# Patient Record
Sex: Male | Born: 2000
Health system: Southern US, Community
[De-identification: ages and names within clinical notes are randomized; demographics above are authoritative.]

## PROBLEM LIST (undated history)

## (undated) DIAGNOSIS — J45909 Unspecified asthma, uncomplicated: Secondary | ICD-10-CM

## (undated) DIAGNOSIS — K649 Unspecified hemorrhoids: Secondary | ICD-10-CM

## (undated) DIAGNOSIS — K219 Gastro-esophageal reflux disease without esophagitis: Secondary | ICD-10-CM

## (undated) DIAGNOSIS — Z8489 Family history of other specified conditions: Secondary | ICD-10-CM

## (undated) DIAGNOSIS — J189 Pneumonia, unspecified organism: Secondary | ICD-10-CM

## (undated) DIAGNOSIS — Z973 Presence of spectacles and contact lenses: Secondary | ICD-10-CM

## (undated) DIAGNOSIS — L0591 Pilonidal cyst without abscess: Secondary | ICD-10-CM

## (undated) DIAGNOSIS — Z87820 Personal history of traumatic brain injury: Secondary | ICD-10-CM

## (undated) DIAGNOSIS — G43909 Migraine, unspecified, not intractable, without status migrainosus: Secondary | ICD-10-CM

## (undated) DIAGNOSIS — Z8709 Personal history of other diseases of the respiratory system: Secondary | ICD-10-CM

## (undated) DIAGNOSIS — H539 Unspecified visual disturbance: Secondary | ICD-10-CM

## (undated) HISTORY — DX: Gastro-esophageal reflux disease without esophagitis: K21.9

## (undated) HISTORY — PX: OTHER SURGICAL HISTORY: SHX169

## (undated) HISTORY — PX: ADENOIDECTOMY: SUR15

## (undated) HISTORY — DX: Migraine, unspecified, not intractable, without status migrainosus: G43.909

---
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
## 2022-05-08 ENCOUNTER — Telehealth: Payer: Self-pay

## 2022-05-08 NOTE — Telephone Encounter (Signed)
Patient mother wanted to know if patient could come on as new patient of Dr.Fry as he sees their whole family and patient has aged out of pediatrics. Okay to schedule?        Please advise

## 2022-05-09 NOTE — Telephone Encounter (Addendum)
Patient Randy [name], mom  requesting if Dr. Sarajane Jews will be willing to see son [name] as new patient.

## 2022-05-10 NOTE — Telephone Encounter (Signed)
Yes I agreed to see him  

## 2022-05-10 NOTE — Telephone Encounter (Signed)
Lvm for mom Randy [name] Dr. Sarajane Jews agreed to see Randy [name] as a new patient.

## 2022-06-13 ENCOUNTER — Ambulatory Visit: Payer: 59 | Admitting: Family Medicine

## 2022-06-20 ENCOUNTER — Ambulatory Visit (INDEPENDENT_AMBULATORY_CARE_PROVIDER_SITE_OTHER): Payer: PRIVATE HEALTH INSURANCE | Admitting: Family Medicine

## 2022-06-20 ENCOUNTER — Encounter: Payer: Self-pay | Admitting: Family Medicine

## 2022-06-20 VITALS — BP 118/78 | HR 79 | Temp 98.3°F | Ht 73.25 in | Wt 206.0 lb

## 2022-06-20 DIAGNOSIS — Z0000 Encounter for general adult medical examination without abnormal findings: Secondary | ICD-10-CM

## 2022-06-20 DIAGNOSIS — G43909 Migraine, unspecified, not intractable, without status migrainosus: Secondary | ICD-10-CM

## 2022-06-20 HISTORY — DX: Migraine, unspecified, not intractable, without status migrainosus: G43.909

## 2022-06-20 MED ORDER — SUMATRIPTAN SUCCINATE 100 MG PO TABS: 100.0000 mg | ORAL_TABLET | ORAL | 11 refills | Status: AC | PRN

## 2022-06-20 MED ORDER — SUMATRIPTAN SUCCINATE 100 MG PO TABS: 100.0000 mg | ORAL_TABLET | ORAL | 11 refills | Status: DC | PRN

## 2022-06-20 NOTE — Progress Notes (Signed)
Subjective:    Patient ID: Randy [name], male    DOB: August 30, 2001, 21 y.o.   MRN: [id number]  HPI Here to establish with [place] after transferring from his pediatrician, Dr. Barton Fanny, and for a well exam. He feels fine and has no concerns. He has been healthy most of his life, although he does have migraine headaches. He averages one of these every month or two. He has never been prescribed anything for these. He had asthma as a child, but he seems to have outgrown this. He was hospitalized in 2019 for a right sided pneumonia that caused a pleural effusion. He was treated with a chest tube. No etiologic agent was ever identified. He recovered completely, and he has no residual problems. Currently he lives with his parents. 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 He would like to return to [place] after that and work for [employer].    Review of Systems  Constitutional: Negative.   HENT: Negative.    Eyes: Negative.   Respiratory: Negative.    Cardiovascular: Negative.   Gastrointestinal: Negative.   Genitourinary: Negative.   Musculoskeletal: Negative.   Skin: Negative.   Neurological:  Positive for headaches.  Psychiatric/Behavioral: Negative.         Objective:   Physical Exam Constitutional:      General: He is not in acute distress.    Appearance: Normal appearance. He is well-developed. He is not diaphoretic.  HENT:     Head: Normocephalic and atraumatic.     Right Ear: External ear normal.     Left Ear: External ear normal.     Nose: Nose normal.     Mouth/Throat:     Pharynx: No oropharyngeal exudate.  Eyes:     General: No scleral icterus.       Right eye: No discharge.        Left eye: No discharge.     Conjunctiva/sclera: Conjunctivae normal.     Pupils: Pupils are equal, round, and reactive to light.  Neck:     Thyroid: No thyromegaly.     Vascular: No JVD.      Trachea: No tracheal deviation.  Cardiovascular:     Rate and Rhythm: Normal rate and regular rhythm.     Heart sounds: Normal heart sounds. No murmur heard.    No friction rub. No gallop.  Pulmonary:     Effort: Pulmonary effort is normal. No respiratory distress.     Breath sounds: Normal breath sounds. No wheezing or rales.  Chest:     Chest wall: No tenderness.  Abdominal:     General: Bowel sounds are normal. There is no distension.     Palpations: Abdomen is soft. There is no mass.     Tenderness: There is no abdominal tenderness. There is no guarding or rebound.  Genitourinary:    Penis: Normal. No tenderness.      Testes: Normal.  Musculoskeletal:        General: No tenderness. Normal range of motion.     Cervical back: Neck supple.  Lymphadenopathy:     Cervical: No cervical adenopathy.  Skin:    General: Skin is warm and dry.     Coloration: Skin is not pale.     Findings: No erythema or rash.  Neurological:     Mental Status: He is alert and oriented to person, place, and  time.     Cranial Nerves: No cranial nerve deficit.     Motor: No abnormal muscle tone.     Coordination: Coordination normal.     Deep Tendon Reflexes: Reflexes are normal and symmetric. Reflexes normal.  Psychiatric:        Behavior: Behavior normal.        Thought Content: Thought content normal.        Judgment: Judgment normal.           Assessment & Plan:  Intro visit and well exam for this young man. He seems to be quite healthy. He will try Sumatriptan 100 mg whenever he gets a migraine. Recheck in one year.  Alysia Penna, MD

## 2022-08-20 ENCOUNTER — Encounter: Payer: Self-pay | Admitting: Family Medicine

## 2022-08-20 ENCOUNTER — Ambulatory Visit (INDEPENDENT_AMBULATORY_CARE_PROVIDER_SITE_OTHER): Payer: PRIVATE HEALTH INSURANCE | Admitting: Family Medicine

## 2022-08-20 VITALS — BP 118/70 | HR 64 | Temp 98.1°F | Wt 203.0 lb

## 2022-08-20 DIAGNOSIS — J020 Streptococcal pharyngitis: Secondary | ICD-10-CM

## 2022-08-20 DIAGNOSIS — J029 Acute pharyngitis, unspecified: Secondary | ICD-10-CM

## 2022-08-20 LAB — POCT RAPID STREP A (OFFICE): Rapid Strep A Screen: POSITIVE — AB

## 2022-08-20 MED ORDER — CEFUROXIME AXETIL 500 MG PO TABS: 500.0000 mg | ORAL_TABLET | Freq: Two times a day (BID) | ORAL | 0 refills | Status: AC

## 2022-08-20 NOTE — Progress Notes (Signed)
   Subjective:    Patient ID: Tyreak Reagle, male    DOB: 2001/03/18, 21 y.o.   MRN: [id number]  HPI Here for 6 days of a ST, fever, PND, and coughing up yellow sputum. No SOB or wheezing. Taking Tylenol.    Review of Systems  Constitutional:  Positive for fever.  HENT:  Positive for congestion, postnasal drip and sore throat. Negative for ear pain and sinus pressure.   Eyes: Negative.   Respiratory:  Positive for cough. Negative for shortness of breath and wheezing.        Objective:   Physical Exam Constitutional:      Appearance: Normal appearance. He is not ill-appearing.  HENT:     Right Ear: Tympanic membrane, ear canal and external ear normal.     Left Ear: Tympanic membrane, ear canal and external ear normal.     Nose: Nose normal.     Mouth/Throat:     Pharynx: Oropharynx is clear.  Eyes:     Conjunctiva/sclera: Conjunctivae normal.  Pulmonary:     Effort: Pulmonary effort is normal.     Breath sounds: Normal breath sounds.  Lymphadenopathy:     Cervical: No cervical adenopathy.  Neurological:     Mental Status: He is alert.           Assessment & Plan:  Strep pharyngitis, treat with 10 days of Cefuroxime.  Alysia Penna, MD

## 2022-08-20 NOTE — Addendum Note (Signed)
Addended by: Wyvonne Lenz on: 08/20/2022 04:21 PM   Modules accepted: Orders

## 2022-08-22 ENCOUNTER — Encounter: Payer: Self-pay | Admitting: Family Medicine

## 2022-08-22 ENCOUNTER — Ambulatory Visit (INDEPENDENT_AMBULATORY_CARE_PROVIDER_SITE_OTHER): Payer: PRIVATE HEALTH INSURANCE | Admitting: Family Medicine

## 2022-08-22 VITALS — BP 118/70 | HR 61 | Temp 98.4°F | Wt 204.0 lb

## 2022-08-22 DIAGNOSIS — K645 Perianal venous thrombosis: Secondary | ICD-10-CM | POA: Diagnosis not present

## 2022-08-22 NOTE — Progress Notes (Signed)
   Subjective:    Patient ID: Randy [name], male    DOB: 03-11-2001, 21 y.o.   MRN: [id number]  HPI Here for one week of mild pain at the anus. His BM's are regular, though he has some pain when passing a stool. No bleeding.    Review of Systems  Constitutional: Negative.   Respiratory: Negative.    Cardiovascular: Negative.   Gastrointestinal:  Positive for rectal pain.       Objective:   Physical Exam Constitutional:      General: He is not in acute distress.    Appearance: Normal appearance.  Cardiovascular:     Rate and Rhythm: Normal rate and regular rhythm.     Pulses: Normal pulses.     Heart sounds: Normal heart sounds.  Pulmonary:     Effort: Pulmonary effort is normal.     Breath sounds: Normal breath sounds.  Genitourinary:    Comments: There is a 1.0 cm thrombosed hemorrhoid at the anal verge  Neurological:     Mental Status: He is alert.           Assessment & Plan:  Thrombosed hemorrhoid. After informed consent was obtained, the hemorrhoid was opened with a scalpel, and all the thrombosed blood was extracted. He tolerated the procedure well, and he felt better afterwards. He will go home now and soak in a warm bath. Recheck as needed.  Alysia Penna, MD

## 2023-02-17 ENCOUNTER — Encounter: Payer: Self-pay | Admitting: Family Medicine

## 2023-02-17 ENCOUNTER — Ambulatory Visit (INDEPENDENT_AMBULATORY_CARE_PROVIDER_SITE_OTHER): Payer: PRIVATE HEALTH INSURANCE | Admitting: Family Medicine

## 2023-02-17 VITALS — BP 120/68 | HR 80 | Temp 99.0°F | Wt 207.0 lb

## 2023-02-17 DIAGNOSIS — L0591 Pilonidal cyst without abscess: Secondary | ICD-10-CM | POA: Diagnosis not present

## 2023-02-17 MED ORDER — MUPIROCIN CALCIUM 2 % EX CREA: 1.0000 | TOPICAL_CREAM | Freq: Two times a day (BID) | CUTANEOUS | 2 refills | Status: AC

## 2023-02-17 NOTE — Progress Notes (Signed)
   Subjective:    Patient ID: Randy [name], male    DOB: 10-09-2000, 22 y.o.   MRN: [id number]  HPI Here for a tender lump at the base of his spine that appeared about 3 years ago. It has slowly gotten larger, and for the past week has been draining clear fluid.     Review of Systems  Constitutional: Negative.   Respiratory: Negative.    Cardiovascular: Negative.   Neurological: Negative.        Objective:   Physical Exam Constitutional:      Appearance: Normal appearance.  Cardiovascular:     Rate and Rhythm: Normal rate and regular rhythm.     Pulses: Normal pulses.     Heart sounds: Normal heart sounds.  Pulmonary:     Effort: Pulmonary effort is normal.     Breath sounds: Normal breath sounds.  Skin:    Comments: There is a round firm non-tender cystic lesion at the left side of the base of the spine. There are 4 tiny perforations in the center of the lower spine that are draining clear fluid   Neurological:     Mental Status: He is alert.           Assessment & Plan:  Pilonidal cyst. This needs to be removed. We will refer him to Neurosurgery. Gershon Crane, MD

## 2023-02-17 NOTE — Addendum Note (Signed)
Addended by: Gershon Crane A on: 02/17/2023 05:02 PM   Modules accepted: Orders

## 2023-03-10 ENCOUNTER — Other Ambulatory Visit (HOSPITAL_COMMUNITY): Payer: Self-pay | Admitting: Neurosurgery

## 2023-03-10 DIAGNOSIS — L0501 Pilonidal cyst with abscess: Secondary | ICD-10-CM

## 2023-03-15 ENCOUNTER — Ambulatory Visit (HOSPITAL_COMMUNITY)
Admission: RE | Admit: 2023-03-15 | Discharge: 2023-03-15 | Disposition: A | Discharge status: Home / Self Care | Payer: PRIVATE HEALTH INSURANCE | Source: Ambulatory Visit | Attending: Neurosurgery | Admitting: Neurosurgery

## 2023-03-15 DIAGNOSIS — L0501 Pilonidal cyst with abscess: Secondary | ICD-10-CM | POA: Diagnosis present

## 2023-03-15 MED ORDER — GADOBUTROL 1 MMOL/ML IV SOLN
10.0000 mL | Freq: Once | INTRAVENOUS | Status: AC | PRN
  Administered 2023-03-15: 10 mL via INTRAVENOUS

## 2023-03-26 ENCOUNTER — Ambulatory Visit: Payer: Self-pay | Admitting: Surgery

## 2023-03-26 NOTE — H&P (Signed)
  Randy [name] [id number]   Referring Provider:  Carmela Hurt, MD   Subjective   Chief Complaint: New Consultation (Pilonidal cyst)     History of Present Illness:    Very pleasant 22 year old male who is referred by Dr. Mikal Plane, to whom he was referred by Dr. Clent Ridges, for a pilonidal cyst.  An MRI was ordered to rule out neural tissue involvement.  This was negative for any soft tissue abnormality, or any other abnormal findings. He noted a lump at the base of his spine that has been present for about 3 years and slowly increasing in size, some serous drainage.    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    Review of Systems: A complete review of systems was obtained from the patient.  I have reviewed this information and discussed as appropriate with the patient.  See HPI as well for other ROS.   Medical History: History reviewed. No pertinent past medical history.  There is no problem list on file for this patient.   Past Surgical History:  Procedure Laterality Date   ADENOIDECTOMY     tubes in ears       No Known Allergies  Current Outpatient Medications on File Prior to Visit  Medication Sig Dispense Refill   acetaminophen 325 mg Cap      ibuprofen (MOTRIN) 200 MG tablet Take by mouth every 6 (six) hours     No current facility-administered medications on file prior to visit.    Family History  Problem Relation Age of Onset   Skin cancer Mother    Deep vein thrombosis (DVT or abnormal blood clot formation) Mother    Skin cancer Sister      Social History   Tobacco Use  Smoking Status Never  Smokeless Tobacco Never     Social History   Socioeconomic History   Marital status: Single  Tobacco Use   Smoking status: Never   Smokeless tobacco: Never  Vaping Use   Vaping status: Never Used  Substance and Sexual Activity   Alcohol use: Never   Drug use: Never    Objective:    Vitals:   03/26/23 1421  BP: 128/76  Pulse: 88  Temp: 36.7 C (98 F)   SpO2: 98%  Weight: 95.2 kg (209 lb 12.8 oz)  Height: 186.7 cm (6' 1.5")  PainSc: 0-No pain    Body mass index is 27.3 kg/m.  Gen: A&Ox3, no distress  Unlabored respirations There is a approximately 1 cm subcutaneous cyst to the left of midline at the superior aspect of the natal cleft.  No opening or active drainage at this time.  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  I recommend trephination and we discussed the procedure in detail as well as risks of bleeding, infection, pain, scarring, wound healing problems, recurrence of the disease process.  Discussed typical post-op recovery and timeline. Questions welcomed and answered to their satisfaction.  They wish to proceed.   Stephani Janak Carlye Grippe, MD

## 2023-04-23 ENCOUNTER — Encounter (HOSPITAL_BASED_OUTPATIENT_CLINIC_OR_DEPARTMENT_OTHER): Payer: Self-pay | Admitting: Surgery

## 2023-04-24 ENCOUNTER — Encounter (HOSPITAL_BASED_OUTPATIENT_CLINIC_OR_DEPARTMENT_OTHER): Payer: Self-pay | Admitting: Surgery

## 2023-04-24 NOTE — Progress Notes (Signed)
Spoke w/ via phone for pre-op interview--- pt's mother, chelsea  Lab needs dos----   no            Lab results------ no COVID test -----patient states asymptomatic no test needed Arrive at ------- 0830 on 05-07-2023 NPO after MN  Med rec completed Medications to take morning of surgery ----- imitrex if needed Diabetic medication ----- n/a Patient instructed no nail polish to be worn day of surgery Patient instructed to bring photo id and insurance card day of surgery Patient aware to have Driver (ride ) / caregiver    for 24 hours after surgery -- mother, chelsea Patient Special Instructions ----- mother verbalized understanding obtain hibiclens soap from any pharmacy, pt to take shower with hibiclens night before and morning of surgery from chin to toe Pre-Op special Instructions -----  pt mother stated pt has a upper front tooth that is not permanent tooth , advised her pt needs to let anesthesia be aware of this in pre-op, mother stated she is aware something could happen to the tooth Patient verbalized understanding of instructions that were given at this phone interview. Patient denies shortness of breath, chest pain, fever, cough at this phone interview.

## 2023-05-07 ENCOUNTER — Encounter (HOSPITAL_BASED_OUTPATIENT_CLINIC_OR_DEPARTMENT_OTHER)
Admission: RE | Disposition: A | Discharge status: Home / Self Care | Payer: Self-pay | Source: Home / Self Care | Attending: Surgery

## 2023-05-07 ENCOUNTER — Ambulatory Visit (HOSPITAL_BASED_OUTPATIENT_CLINIC_OR_DEPARTMENT_OTHER)
Admission: RE | Admit: 2023-05-07 | Discharge: 2023-05-07 | Disposition: A | Discharge status: Home / Self Care | Payer: PRIVATE HEALTH INSURANCE | Attending: Surgery | Admitting: Surgery

## 2023-05-07 ENCOUNTER — Encounter (HOSPITAL_BASED_OUTPATIENT_CLINIC_OR_DEPARTMENT_OTHER): Payer: Self-pay | Admitting: Surgery

## 2023-05-07 ENCOUNTER — Ambulatory Visit (HOSPITAL_BASED_OUTPATIENT_CLINIC_OR_DEPARTMENT_OTHER): Payer: PRIVATE HEALTH INSURANCE | Admitting: Anesthesiology

## 2023-05-07 ENCOUNTER — Other Ambulatory Visit: Payer: Self-pay

## 2023-05-07 DIAGNOSIS — Z01818 Encounter for other preprocedural examination: Secondary | ICD-10-CM

## 2023-05-07 DIAGNOSIS — L0591 Pilonidal cyst without abscess: Secondary | ICD-10-CM | POA: Diagnosis present

## 2023-05-07 HISTORY — DX: Personal history of other diseases of the respiratory system: Z87.09

## 2023-05-07 HISTORY — DX: Personal history of traumatic brain injury: Z87.820

## 2023-05-07 HISTORY — DX: Family history of other specified conditions: Z84.89

## 2023-05-07 HISTORY — DX: Pilonidal cyst without abscess: L05.91

## 2023-05-07 HISTORY — DX: Presence of spectacles and contact lenses: Z97.3

## 2023-05-07 HISTORY — DX: Unspecified hemorrhoids: K64.9

## 2023-05-07 HISTORY — PX: PILONIDAL CYST EXCISION: SHX744

## 2023-05-07 SURGERY — EXCISION, SIMPLE PILONIDAL CYST
Anesthesia: Monitor Anesthesia Care

## 2023-05-07 MED ORDER — KETOROLAC TROMETHAMINE 30 MG/ML IJ SOLN
INTRAMUSCULAR | Status: DC | PRN
  Administered 2023-05-07: 30 mg via INTRAVENOUS

## 2023-05-07 MED ORDER — GABAPENTIN 300 MG PO CAPS
300.0000 mg | ORAL_CAPSULE | ORAL | Status: AC
  Administered 2023-05-07: 300 mg via ORAL

## 2023-05-07 MED ORDER — ACETAMINOPHEN 500 MG PO TABS
ORAL_TABLET | ORAL | Status: AC
  Filled 2023-05-07: qty 2

## 2023-05-07 MED ORDER — MIDAZOLAM HCL 2 MG/2ML IJ SOLN
INTRAMUSCULAR | Status: DC | PRN
  Administered 2023-05-07: 2 mg via INTRAVENOUS

## 2023-05-07 MED ORDER — OXYCODONE HCL 5 MG/5ML PO SOLN: 5.0000 mg | Freq: Once | ORAL | Status: DC | PRN

## 2023-05-07 MED ORDER — CHLORHEXIDINE GLUCONATE 4 % EX SOLN: 60.0000 mL | Freq: Once | CUTANEOUS | Status: DC

## 2023-05-07 MED ORDER — MIDAZOLAM HCL 2 MG/2ML IJ SOLN
INTRAMUSCULAR | Status: AC
  Filled 2023-05-07: qty 2

## 2023-05-07 MED ORDER — BUPIVACAINE-EPINEPHRINE 0.25% -1:200000 IJ SOLN
INTRAMUSCULAR | Status: DC | PRN
  Administered 2023-05-07: 19 mL

## 2023-05-07 MED ORDER — FENTANYL CITRATE (PF) 100 MCG/2ML IJ SOLN
INTRAMUSCULAR | Status: DC | PRN
  Administered 2023-05-07: 25 ug via INTRAVENOUS

## 2023-05-07 MED ORDER — CEFAZOLIN SODIUM-DEXTROSE 2-4 GM/100ML-% IV SOLN
INTRAVENOUS | Status: AC
  Filled 2023-05-07: qty 100

## 2023-05-07 MED ORDER — GABAPENTIN 300 MG PO CAPS
ORAL_CAPSULE | ORAL | Status: AC
  Filled 2023-05-07: qty 1

## 2023-05-07 MED ORDER — ONDANSETRON HCL 4 MG/2ML IJ SOLN
INTRAMUSCULAR | Status: DC | PRN
  Administered 2023-05-07: 4 mg via INTRAVENOUS

## 2023-05-07 MED ORDER — LACTATED RINGERS IV SOLN: INTRAVENOUS | Status: DC

## 2023-05-07 MED ORDER — FENTANYL CITRATE (PF) 100 MCG/2ML IJ SOLN: 25.0000 ug | INTRAMUSCULAR | Status: DC | PRN

## 2023-05-07 MED ORDER — PROPOFOL 500 MG/50ML IV EMUL
INTRAVENOUS | Status: DC | PRN
  Administered 2023-05-07: 150 ug/kg/min via INTRAVENOUS

## 2023-05-07 MED ORDER — DOCUSATE SODIUM 100 MG PO CAPS: 100.0000 mg | ORAL_CAPSULE | Freq: Two times a day (BID) | ORAL | 0 refills | Status: AC

## 2023-05-07 MED ORDER — ACETAMINOPHEN 500 MG PO TABS
1000.0000 mg | ORAL_TABLET | ORAL | Status: AC
  Administered 2023-05-07: 1000 mg via ORAL

## 2023-05-07 MED ORDER — VANCOMYCIN HCL IN DEXTROSE 1-5 GM/200ML-% IV SOLN
INTRAVENOUS | Status: AC
  Filled 2023-05-07: qty 200

## 2023-05-07 MED ORDER — OXYCODONE HCL 5 MG PO TABS: 5.0000 mg | ORAL_TABLET | Freq: Once | ORAL | Status: DC | PRN

## 2023-05-07 MED ORDER — BUPIVACAINE LIPOSOME 1.3 % IJ SUSP: 20.0000 mL | Freq: Once | INTRAMUSCULAR | Status: DC

## 2023-05-07 MED ORDER — FENTANYL CITRATE (PF) 100 MCG/2ML IJ SOLN
INTRAMUSCULAR | Status: AC
  Filled 2023-05-07: qty 2

## 2023-05-07 MED ORDER — DEXMEDETOMIDINE HCL IN NACL 80 MCG/20ML IV SOLN
INTRAVENOUS | Status: DC | PRN
  Administered 2023-05-07: 8 ug via INTRAVENOUS

## 2023-05-07 MED ORDER — ONDANSETRON HCL 4 MG/2ML IJ SOLN: 4.0000 mg | Freq: Four times a day (QID) | INTRAMUSCULAR | Status: DC | PRN

## 2023-05-07 MED ORDER — CEFAZOLIN SODIUM-DEXTROSE 2-4 GM/100ML-% IV SOLN
2.0000 g | INTRAVENOUS | Status: AC
  Administered 2023-05-07: 2 g via INTRAVENOUS

## 2023-05-07 MED ORDER — DEXAMETHASONE SODIUM PHOSPHATE 10 MG/ML IJ SOLN
INTRAMUSCULAR | Status: DC | PRN
  Administered 2023-05-07: 10 mg via INTRAVENOUS

## 2023-05-07 MED ORDER — HYDROGEN PEROXIDE 3 % EX SOLN
CUTANEOUS | Status: DC | PRN
  Administered 2023-05-07: 1

## 2023-05-07 MED ORDER — OXYCODONE HCL 5 MG PO TABS: 5.0000 mg | ORAL_TABLET | Freq: Three times a day (TID) | ORAL | 0 refills | Status: AC | PRN

## 2023-05-07 MED ORDER — LIDOCAINE HCL (CARDIAC) PF 100 MG/5ML IV SOSY
PREFILLED_SYRINGE | INTRAVENOUS | Status: DC | PRN
  Administered 2023-05-07: 50 mg via INTRAVENOUS

## 2023-05-07 SURGICAL SUPPLY — 41 items
APL SKNCLS STERI-STRIP NONHPOA (GAUZE/BANDAGES/DRESSINGS) ×1
BENZOIN TINCTURE PRP APPL 2/3 (GAUZE/BANDAGES/DRESSINGS) ×1 IMPLANT
BLADE EXTENDED COATED 6.5IN (ELECTRODE) IMPLANT
BLADE SURG 10 STRL SS (BLADE) IMPLANT
BLADE SURG 15 STRL LF DISP TIS (BLADE) ×1 IMPLANT
BLADE SURG 15 STRL SS (BLADE) ×1
BRIEF MESH DISP LRG (UNDERPADS AND DIAPERS) IMPLANT
COVER BACK TABLE 60X90IN (DRAPES) ×1 IMPLANT
COVER MAYO STAND STRL (DRAPES) ×1 IMPLANT
DRAIN PENROSE 0.25X18 (DRAIN) IMPLANT
DRAPE LAPAROTOMY 100X72 PEDS (DRAPES) ×1 IMPLANT
DRAPE UTILITY XL STRL (DRAPES) ×1 IMPLANT
ELECT REM PT RETURN 9FT ADLT (ELECTROSURGICAL) ×1
ELECTRODE REM PT RTRN 9FT ADLT (ELECTROSURGICAL) ×1 IMPLANT
GAUZE 4X4 16PLY ~~LOC~~+RFID DBL (SPONGE) ×1 IMPLANT
GAUZE PAD ABD 8X10 STRL (GAUZE/BANDAGES/DRESSINGS) ×1 IMPLANT
GAUZE SPONGE 4X4 12PLY STRL (GAUZE/BANDAGES/DRESSINGS) ×1 IMPLANT
GLOVE BIO SURGEON STRL SZ 6 (GLOVE) ×1 IMPLANT
GLOVE INDICATOR 6.5 STRL GRN (GLOVE) ×1 IMPLANT
GOWN STRL REUS W/TWL LRG LVL3 (GOWN DISPOSABLE) ×1 IMPLANT
HYDROGEN PEROXIDE 16OZ (MISCELLANEOUS) ×1 IMPLANT
IV CATH 14GX2 1/4 (CATHETERS) IMPLANT
KIT TURNOVER CYSTO (KITS) ×1 IMPLANT
NDL HYPO 25X1 1.5 SAFETY (NEEDLE) ×1 IMPLANT
NDL SAFETY ECLIP 18X1.5 (MISCELLANEOUS) IMPLANT
NEEDLE HYPO 25X1 1.5 SAFETY (NEEDLE) ×1
NS IRRIG 500ML POUR BTL (IV SOLUTION) ×1 IMPLANT
PACK BASIN DAY SURGERY FS (CUSTOM PROCEDURE TRAY) ×1 IMPLANT
PAD ARMBOARD 7.5X6 YLW CONV (MISCELLANEOUS) IMPLANT
PENCIL SMOKE EVACUATOR (MISCELLANEOUS) ×1 IMPLANT
PUNCH BIOPSY DERMAL 3 (INSTRUMENTS) IMPLANT
PUNCH BIOPSY DERMAL 3MM (INSTRUMENTS) ×1
SLEEVE SCD COMPRESS KNEE MED (STOCKING) ×1 IMPLANT
SUT CHROMIC 3 0 SH 27 (SUTURE) IMPLANT
SYR 30ML LL (SYRINGE) ×1 IMPLANT
SYR CONTROL 10ML LL (SYRINGE) ×2 IMPLANT
TAPE HYPAFIX 4 X10 (GAUZE/BANDAGES/DRESSINGS) IMPLANT
TOWEL OR 17X24 6PK STRL BLUE (TOWEL DISPOSABLE) ×2 IMPLANT
TRAY DSU PREP LF (CUSTOM PROCEDURE TRAY) ×1 IMPLANT
TUBE CONNECTING 12X1/4 (SUCTIONS) ×1 IMPLANT
YANKAUER SUCT BULB TIP NO VENT (SUCTIONS) ×1 IMPLANT

## 2023-05-07 NOTE — H&P (Signed)
  Esmond Hinch [id number]   Referring Provider:  Carmela Hurt, MD   Subjective   Chief Complaint: New Consultation (Pilonidal cyst)     History of Present Illness:    Very pleasant 22 year old male who is referred by Dr. Mikal Plane, to whom he was referred by Dr. Clent Ridges, for a pilonidal cyst.  An MRI was ordered to rule out neural tissue involvement.  This was negative for any soft tissue abnormality, or any other abnormal findings. He noted a lump at the base of his spine that has been present for about 3 years and slowly increasing in size, some serous drainage.    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    Review of Systems: A complete review of systems was obtained from the patient.  I have reviewed this information and discussed as appropriate with the patient.  See HPI as well for other ROS.   Medical History: History reviewed. No pertinent past medical history.  There is no problem list on file for this patient.   Past Surgical History:  Procedure Laterality Date   ADENOIDECTOMY     tubes in ears       No Known Allergies  Current Outpatient Medications on File Prior to Visit  Medication Sig Dispense Refill   acetaminophen 325 mg Cap      ibuprofen (MOTRIN) 200 MG tablet Take by mouth every 6 (six) hours     No current facility-administered medications on file prior to visit.    Family History  Problem Relation Age of Onset   Skin cancer Mother    Deep vein thrombosis (DVT or abnormal blood clot formation) Mother    Skin cancer Sister      Social History   Tobacco Use  Smoking Status Never  Smokeless Tobacco Never     Social History   Socioeconomic History   Marital status: Single  Tobacco Use   Smoking status: Never   Smokeless tobacco: Never  Vaping Use   Vaping status: Never Used  Substance and Sexual Activity   Alcohol use: Never   Drug use: Never    Objective:    Vitals:   03/26/23 1421  BP: 128/76  Pulse: 88  Temp: 36.7 C (98 F)   SpO2: 98%  Weight: 95.2 kg (209 lb 12.8 oz)  Height: 186.7 cm (6' 1.5")  PainSc: 0-No pain    Body mass index is 27.3 kg/m.  Gen: A&Ox3, no distress  Unlabored respirations There is a approximately 1 cm subcutaneous cyst to the left of midline at the superior aspect of the natal cleft.  No opening or active drainage at this time.  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  I recommend trephination and we discussed the procedure in detail as well as risks of bleeding, infection, pain, scarring, wound healing problems, recurrence of the disease process.  Discussed typical post-op recovery and timeline. Questions welcomed and answered to their satisfaction.  They wish to proceed.   CHELSEA Carlye Grippe, MD

## 2023-05-07 NOTE — Anesthesia Preprocedure Evaluation (Signed)
Anesthesia Evaluation  Patient identified by MRN, date of birth, ID band Patient awake    Reviewed: Allergy & Precautions, H&P , NPO status , Patient's Chart, lab work & pertinent test results  Airway Mallampati: II   Neck ROM: full    Dental   Pulmonary    breath sounds clear to auscultation       Cardiovascular negative cardio ROS  Rhythm:regular Rate:Normal     Neuro/Psych  Headaches    GI/Hepatic   Endo/Other    Renal/GU      Musculoskeletal   Abdominal   Peds  Hematology   Anesthesia Other Findings   Reproductive/Obstetrics                             Anesthesia Physical Anesthesia Plan  ASA: 1  Anesthesia Plan: MAC   Post-op Pain Management:    Induction: Intravenous  PONV Risk Score and Plan: 1 and Propofol infusion, Treatment may vary due to age or medical condition and Midazolam  Airway Management Planned: Simple Face Mask  Additional Equipment:   Intra-op Plan:   Post-operative Plan:   Informed Consent: I have reviewed the patients History and Physical, chart, labs and discussed the procedure including the risks, benefits and alternatives for the proposed anesthesia with the patient or authorized representative who has indicated his/her understanding and acceptance.     Dental advisory given  Plan Discussed with: CRNA, Anesthesiologist and Surgeon  Anesthesia Plan Comments:        Anesthesia Quick Evaluation

## 2023-05-07 NOTE — Op Note (Signed)
Operative Note  Atticus Xayavong  [id number]  [id number]  05/07/2023   Surgeon: Phylliss Blakes MD FACS   Assistant: Saunders Glance PA-C   Procedure performed: trephination of pilonidal cyst   Preop diagnosis: pilonidal cyst Post-op diagnosis/intraop findings: same   Specimens: no Retained items: no  EBL: minimal cc Complications: none   Description of procedure: After confiming informed consent the patient was taken to the operating room and placed prone on operating room table where MAC was initiated, preoperative antibiotics were administered, SCDs applied, and a formal timeout was performed.  The buttocks was clipped, gently taped apart, prepped and draped in usual sterile fashion.  After infiltration with local (quarter percent Marcaine with epinephrine) a 3 mm punch was used to excise the 3 small midline pits.  An approximately 1 cm incision was made over the cyst to the left of midline and cautery used to dissect through the soft tissues until the cyst cavity was encountered.  We encountered a large amount of chronically inspissated hair which was debrided.  Once we had cleared the cyst, sinus tract and pits of all inspissated hair, some of the cyst wall was excised with cautery and then the tracts and cyst cavity were all additionally debrided with a curette.  The wound and sinus cavity were flushed with sterile saline followed by hydrogen peroxide and no additional foreign material was present.  Hemostasis was ensured within the wound.  A dry gauze dressing was applied.  The patient was then awakened and taken to PACU in stable condition.    All counts were correct at the completion of the case.

## 2023-05-07 NOTE — Transfer of Care (Signed)
Immediate Anesthesia Transfer of Care Note  Patient: Randy [name]  Procedure(s) Performed: TREPHINATION OF PILONIDALCYST  Patient Location: PACU  Anesthesia Type:MAC  Level of Consciousness: awake, alert , oriented, and patient cooperative  Airway & Oxygen Therapy: Patient Spontanous Breathing  Post-op Assessment: Report given to RN and Post -op Vital signs reviewed and stable  Post vital signs: Reviewed and stable  Last Vitals:  Vitals Value Taken Time  BP    Temp    Pulse 75 05/07/23 1123  Resp 21 05/07/23 1123  SpO2 99 % 05/07/23 1123  Vitals shown include unfiled device data.  Last Pain:  Vitals:   05/07/23 0849  TempSrc: Oral  PainSc: 0-No pain      Patients Stated Pain Goal: 6 (05/07/23 0849)  Complications: No notable events documented.

## 2023-05-07 NOTE — Discharge Instructions (Addendum)
Postoperative Instructions Surgery for Pilonidal Disease Restrictions  You may shower after 24 hours but you should avoid soaking in water for more than ten minutes.  There are no dietary restrictions.  You should avoid alcohol while you are on narcotic pain medication.  Activity can be as tolerated.  Wound care - Your incisions were intentionally left open. Cover the incision with dry gauze, secured with tape. This should be changed at least daily or whenever the dressing gets wet. If the gauze sticks to the wound, ok to apply a thin layer of Aquaphor or vaseline to the raw surface which should be cleaned off daily when bathing. The wound will heal over the next few weeks.  - Hair removal using clippers, waxing or depilatory creams around the area will help reduce the chance of recurrence. This can be initiated once the wound starts to heal.    Medications  You will be given a prescription for pain medicine when you are discharged from the hospital. In addition to the prescription pain medicine that you were given, you may take Tylenol AND Motrin, Advil, or ibuprofen at the same time. This often gives better pain relief than either one by itself.  Stop the prescription and switch to over-the-counter medications as soon as these medications can control your pain. (This will reduce your risk of constipation.) -Most patients will experience some swelling and bruising around the incisions.  Ice packs or heating pads (30-60 minutes up to 6 times a day) will help. Use ice for the first few days to help decrease swelling and bruising, then switch to heat to help relax tight/sore spots and speed recovery.  Some people prefer to use ice alone, heat alone, alternating between ice & heat.  Experiment to what works for you.  Swelling and bruising can take several weeks to resolve.  Take Colace 100 mg two times daily until you are seen in the office for your followup visit.  Call the office if:  The  pain worsens and you need to increase the amount of pain medication.  You experience persistent fever or chills. (You may have lowgrade fevers on and off for the days following surgery? this is your body's normal reaction to surgery.)  There is redness of more than  inch around the incisions.  There is drainage of cloudy fluid or pus (Drainage of a yellowish bloody fluid is normal.)  You experience persistent nausea or vomiting.   Please call the office [phone]) to make a followup appointment for one to two weeks after surgery and if you have any other problems, questions, or concerns.   The clinic staff is available to answer your questions during regular business hours (8:30am-5pm).  Please don't hesitate to call and ask to speak to one of our nurses for clinical concerns.              If you have disability or family leave forms, bring them to the office for processing. Do not give them to your doctor.  If you have a medical emergency, go to the nearest emergency room or call 911.  A surgeon from [facility] is always on call at the [facility], [place] [address], [address], [place], [place]  [address] ? MAIN: [phone] ? TOLL FREE: [phone] ?  FAX [phone] [email or web address]        Post Anesthesia Home Care Instructions  Activity: Get plenty of rest for the remainder of the  day. A responsible individual must stay with you for 24 hours following the procedure.  For the next 24 hours, DO NOT: -Drive a car -Advertising copywriter -Drink alcoholic beverages -Take any medication unless instructed by your physician -Make any legal decisions or sign important papers.  Meals: Start with liquid foods such as gelatin or soup. Progress to regular foods as tolerated. Avoid greasy, spicy, heavy foods. If nausea and/or vomiting occur, drink only clear liquids until the nausea and/or vomiting subsides. Call your  physician if vomiting continues.  Special Instructions/Symptoms: Your throat may feel dry or sore from the anesthesia or the breathing tube placed in your throat during surgery. If this causes discomfort, gargle with warm salt water. The discomfort should disappear within 24 hours.

## 2023-05-07 NOTE — Anesthesia Postprocedure Evaluation (Signed)
Anesthesia Post Note  Patient: Shmaya Macek  Procedure(s) Performed: TREPHINATION OF PILONIDALCYST     Patient location during evaluation: PACU Anesthesia Type: MAC Level of consciousness: awake and alert Pain management: pain level controlled Vital Signs Assessment: post-procedure vital signs reviewed and stable Respiratory status: spontaneous breathing, nonlabored ventilation, respiratory function stable and patient connected to nasal cannula oxygen Cardiovascular status: stable and blood pressure returned to baseline Postop Assessment: no apparent nausea or vomiting Anesthetic complications: no   No notable events documented.  Last Vitals:  Vitals:   05/07/23 1125 05/07/23 1245  BP:  (!) 119/53  Pulse:  63  Resp:  16  Temp: (!) 36.4 C 36.5 C  SpO2:  100%    Last Pain:  Vitals:   05/07/23 1245  TempSrc:   PainSc: 0-No pain                 Elin Seats S

## 2023-05-09 ENCOUNTER — Encounter (HOSPITAL_BASED_OUTPATIENT_CLINIC_OR_DEPARTMENT_OTHER): Payer: Self-pay | Admitting: Surgery

## 2023-12-03 ENCOUNTER — Ambulatory Visit: Payer: Self-pay

## 2023-12-03 NOTE — Telephone Encounter (Signed)
 Copied from CRM [id number]. 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 The patient is without fever, constipation, or abdominal discomfort. The patient works within [employer] and states he has been lifting pretty heavy objects as of late, and believes this has contributed to what he believes is a hemorrhoid. In office appointment made with PCP for tomorrow.   Reason for Disposition  [1] Home treatment > 3 days for rectal pain AND [2] not improved  Answer Assessment - Initial Assessment Questions 1. SYMPTOM:  "What's the main symptom you're concerned about?" (e.g., pain, itching, swelling, rash)     Discomfort, Swelling  2. ONSET: "When did the symptoms  start?"     2 Days  3. RECTAL PAIN: "Do you have any pain around your rectum?" "How bad is the pain?"  (Scale 0-10; or mild, moderate, severe)   - NONE (0): no pain   - MILD (1-3): doesn't interfere with normal activities    - MODERATE (4-7): interferes with normal activities or awakens from sleep, limping    - SEVERE (8-10): excruciating pain, unable to have a bowel movement      2  4. RECTAL ITCHING: "Do you have any itching in this area?" "How bad is the itching?"  (Scale 0-10; or mild, moderate, severe)   - NONE: no itching   - MILD: doesn't interfere with normal activities    - MODERATE-SEVERE:  interferes with normal activities or awakens from sleep     None  5. CONSTIPATION: "Do you have constipation?" If Yes, ask: "How bad is it?"     No  6. CAUSE: "What do you think is causing the anus symptoms?"     Hemorrhoids  7. OTHER SYMPTOMS: "Do you have any other symptoms?"  (e.g., abdomen pain, fever, rectal bleeding, vomiting)     No  Protocols used: Rectal Symptoms-A-AH

## 2023-12-04 ENCOUNTER — Encounter: Payer: Self-pay | Admitting: Family Medicine

## 2023-12-04 ENCOUNTER — Ambulatory Visit (INDEPENDENT_AMBULATORY_CARE_PROVIDER_SITE_OTHER): Payer: PRIVATE HEALTH INSURANCE | Admitting: Family Medicine

## 2023-12-04 VITALS — BP 110/76 | HR 68 | Temp 98.3°F | Wt 206.0 lb

## 2023-12-04 DIAGNOSIS — K649 Unspecified hemorrhoids: Secondary | ICD-10-CM | POA: Insufficient documentation

## 2023-12-04 MED ORDER — HYDROCORTISONE (PERIANAL) 2.5 % EX CREA: 1.0000 | TOPICAL_CREAM | Freq: Two times a day (BID) | CUTANEOUS | 2 refills | Status: AC

## 2023-12-04 NOTE — Progress Notes (Signed)
   Subjective:    Patient ID: Randy [name], male    DOB: Jul 04, 2001, 23 y.o.   MRN: [id number]  HPI Here for 3 days of a small hemorrhoid. This bothers him but it is not painful. No bleeding. He passes his stool easily without straining. He has been applying Preparation H.    Review of Systems  Constitutional: Negative.   Respiratory: Negative.    Cardiovascular: Negative.   Gastrointestinal: Negative.        Objective:   Physical Exam Constitutional:      Appearance: Normal appearance.  Cardiovascular:     Rate and Rhythm: Normal rate and regular rhythm.     Pulses: Normal pulses.     Heart sounds: Normal heart sounds.  Pulmonary:     Effort: Pulmonary effort is normal.     Breath sounds: Normal breath sounds.  Genitourinary:    Comments: There is a small non-tender external hemorrhoid at the anal verge  Neurological:     Mental Status: He is alert.           Assessment & Plan:  External hemorrhoid. He will try Anusol HC cream BID. Recheck as needed.  Gershon Crane, MD

## 2023-12-04 NOTE — Telephone Encounter (Signed)
 Pt had a visit with Dr Clent Ridges this morning for this problem

## 2024-06-29 ENCOUNTER — Ambulatory Visit (INDEPENDENT_AMBULATORY_CARE_PROVIDER_SITE_OTHER): Payer: PRIVATE HEALTH INSURANCE | Admitting: Family Medicine

## 2024-06-29 ENCOUNTER — Encounter: Payer: Self-pay | Admitting: Family Medicine

## 2024-06-29 VITALS — BP 118/76 | HR 67 | Temp 98.0°F | Ht 73.75 in | Wt 214.0 lb

## 2024-06-29 DIAGNOSIS — Z0000 Encounter for general adult medical examination without abnormal findings: Secondary | ICD-10-CM | POA: Diagnosis not present

## 2024-06-29 LAB — CBC WITH DIFFERENTIAL/PLATELET
Basophils Absolute: 0 K/uL (ref 0.0–0.1)
Basophils Relative: 1.1 % (ref 0.0–3.0)
Eosinophils Absolute: 0.1 K/uL (ref 0.0–0.7)
Eosinophils Relative: 3.1 % (ref 0.0–5.0)
HCT: 46.7 % (ref 39.0–52.0)
Hemoglobin: 16.1 g/dL (ref 13.0–17.0)
Lymphocytes Relative: 33.6 % (ref 12.0–46.0)
Lymphs Abs: 1.4 K/uL (ref 0.7–4.0)
MCHC: 34.3 g/dL (ref 30.0–36.0)
MCV: 85.7 fl (ref 78.0–100.0)
Monocytes Absolute: 0.4 K/uL (ref 0.1–1.0)
Monocytes Relative: 9 % (ref 3.0–12.0)
Neutro Abs: 2.2 K/uL (ref 1.4–7.7)
Neutrophils Relative %: 53.2 % (ref 43.0–77.0)
Platelets: 148 K/uL — ABNORMAL LOW (ref 150.0–400.0)
RBC: 5.45 Mil/uL (ref 4.22–5.81)
RDW: 13.1 % (ref 11.5–15.5)
WBC: 4.1 K/uL (ref 4.0–10.5)

## 2024-06-29 LAB — HEPATIC FUNCTION PANEL
ALT: 34 U/L (ref 0–53)
AST: 21 U/L (ref 0–37)
Albumin: 4.7 g/dL (ref 3.5–5.2)
Alkaline Phosphatase: 57 U/L (ref 39–117)
Bilirubin, Direct: 0.2 mg/dL (ref 0.0–0.3)
Total Bilirubin: 0.6 mg/dL (ref 0.2–1.2)
Total Protein: 7.1 g/dL (ref 6.0–8.3)

## 2024-06-29 LAB — LIPID PANEL
Cholesterol: 108 mg/dL (ref 0–200)
HDL: 45.3 mg/dL (ref >39.00)
LDL Cholesterol: 55 mg/dL (ref 0–99)
NonHDL: 62.71
Total CHOL/HDL Ratio: 2
Triglycerides: 41 mg/dL (ref 0.0–149.0)
VLDL: 8.2 mg/dL (ref 0.0–40.0)

## 2024-06-29 LAB — BASIC METABOLIC PANEL WITH GFR
BUN: 17 mg/dL (ref 6–23)
CO2: 30 meq/L (ref 19–32)
Calcium: 9.7 mg/dL (ref 8.4–10.5)
Chloride: 104 meq/L (ref 96–112)
Creatinine, Ser: 0.98 mg/dL (ref 0.40–1.50)
GFR: 110.31 mL/min (ref >60.00)
Glucose, Bld: 88 mg/dL (ref 70–99)
Potassium: 4.1 meq/L (ref 3.5–5.1)
Sodium: 141 meq/L (ref 135–145)

## 2024-06-29 LAB — TSH: TSH: 1.41 u[IU]/mL (ref 0.35–5.50)

## 2024-06-29 LAB — HEMOGLOBIN A1C: Hgb A1c MFr Bld: 5 % (ref 4.6–6.5)

## 2024-06-29 NOTE — Progress Notes (Signed)
 Subjective:    Patient ID: Randy [name], male    DOB: 05-Feb-2001, 23 y.o.   MRN: [id number]  HPI Here for a well exam. He feels great. His migraines are much less frequent, and Sumatriptan  still works well. He is currently living in [place], and he works for a [employer] that [employer]	[place] [employer].    Review of Systems  Constitutional: Negative.   HENT: Negative.    Eyes: Negative.   Respiratory: Negative.    Cardiovascular: Negative.   Gastrointestinal: Negative.   Genitourinary: Negative.   Musculoskeletal: Negative.   Skin: Negative.   Neurological: Negative.   Psychiatric/Behavioral: Negative.         Objective:   Physical Exam Constitutional:      General: He is not in acute distress.    Appearance: Normal appearance. He is well-developed. He is not diaphoretic.  HENT:     Head: Normocephalic and atraumatic.     Right Ear: External ear normal.     Left Ear: External ear normal.     Nose: Nose normal.     Mouth/Throat:     Pharynx: No oropharyngeal exudate.  Eyes:     General: No scleral icterus.       Right eye: No discharge.        Left eye: No discharge.     Conjunctiva/sclera: Conjunctivae normal.     Pupils: Pupils are equal, round, and reactive to light.  Neck:     Thyroid: No thyromegaly.     Vascular: No JVD.     Trachea: No tracheal deviation.  Cardiovascular:     Rate and Rhythm: Normal rate and regular rhythm.     Pulses: Normal pulses.     Heart sounds: Normal heart sounds. No murmur heard.    No friction rub. No gallop.  Pulmonary:     Effort: Pulmonary effort is normal. No respiratory distress.     Breath sounds: Normal breath sounds. No wheezing or rales.  Chest:     Chest wall: No tenderness.  Abdominal:     General: Bowel sounds are normal. There is no distension.     Palpations: Abdomen is soft. There is no mass.     Tenderness: There is no abdominal tenderness. There is no guarding or rebound.  Genitourinary:     Penis: Normal. No tenderness.      Testes: Normal.  Musculoskeletal:        General: No tenderness. Normal range of motion.     Cervical back: Neck supple.  Lymphadenopathy:     Cervical: No cervical adenopathy.  Skin:    General: Skin is warm and dry.     Coloration: Skin is not pale.     Findings: No erythema or rash.  Neurological:     General: No focal deficit present.     Mental Status: He is alert and oriented to person, place, and time.     Cranial Nerves: No cranial nerve deficit.     Motor: No abnormal muscle tone.     Coordination: Coordination normal.     Deep Tendon Reflexes: Reflexes are normal and symmetric. Reflexes normal.  Psychiatric:        Mood and Affect: Mood normal.        Behavior: Behavior normal.        Thought Content: Thought content normal.        Judgment: Judgment normal.           Assessment & Plan:  Well exam. We discussed diet and exercise. Get fasting labs. Garnette [name], MD

## 2024-07-01 ENCOUNTER — Ambulatory Visit: Payer: Self-pay | Admitting: Family Medicine

## 2024-07-01 ENCOUNTER — Telehealth: Payer: Self-pay | Admitting: *Deleted

## 2024-07-01 NOTE — Telephone Encounter (Signed)
 Copied from CRM #[id number]. Topic: Clinical - Lab/Test Results >> Jul 01, 2024  1:22 PM Roselie BROCKS wrote: Reason for CRM: Patient returned a call for lab work, I provided information in the notes.  Patient understood.

## 2024-07-07 NOTE — Telephone Encounter (Signed)
 Noted

## 2024-09-15 ENCOUNTER — Other Ambulatory Visit: Payer: Self-pay

## 2024-09-15 ENCOUNTER — Telehealth: Payer: Self-pay

## 2024-09-15 DIAGNOSIS — D696 Thrombocytopenia, unspecified: Secondary | ICD-10-CM

## 2024-09-15 NOTE — Telephone Encounter (Signed)
 Copied from CRM [id number]. Topic: Clinical - Request for Lab/Test Order >> Sep 15, 2024 11:47 AM Montie POUR wrote: Reason for CRM:  Please add lab order per chart note: Randy DELENA [name], MD 07/01/2024  8:04 AM EDT  Normal except his platelet count is slightly low, similar to what it was a few years ago. Let's recheck a CBC in 90 days Please call [name] at [phone] to schedule a lab appointment when orders are in his chart. He needs to schedule lab appointment around beginning of February due to scheduling time off at work.

## 2024-09-15 NOTE — Telephone Encounter (Signed)
 Pt scheduled for a lab appointment, order placed on epic

## 2024-10-15 ENCOUNTER — Other Ambulatory Visit: Payer: PRIVATE HEALTH INSURANCE
